# Patient Record
Sex: Male | Born: 1980 | Race: White | Hispanic: No | Marital: Married | Smoking: Never smoker
Health system: Southern US, Community
[De-identification: ages and names within clinical notes are randomized; demographics above are authoritative.]

## PROBLEM LIST (undated history)

## (undated) DIAGNOSIS — J45909 Unspecified asthma, uncomplicated: Secondary | ICD-10-CM

## (undated) HISTORY — PX: BONE MARROW TRANSPLANT: SHX200

---

## 2021-02-04 ENCOUNTER — Emergency Department
Admission: EM | Admit: 2021-02-04 | Discharge: 2021-02-04 | Disposition: A | Discharge status: Home / Self Care | Payer: Commercial Managed Care - PPO | Attending: Emergency Medicine | Admitting: Emergency Medicine

## 2021-02-04 ENCOUNTER — Other Ambulatory Visit: Payer: Self-pay

## 2021-02-04 ENCOUNTER — Emergency Department: Payer: Commercial Managed Care - PPO

## 2021-02-04 ENCOUNTER — Encounter: Payer: Self-pay | Admitting: Emergency Medicine

## 2021-02-04 DIAGNOSIS — Z20822 Contact with and (suspected) exposure to covid-19: Secondary | ICD-10-CM | POA: Insufficient documentation

## 2021-02-04 DIAGNOSIS — J40 Bronchitis, not specified as acute or chronic: Secondary | ICD-10-CM | POA: Insufficient documentation

## 2021-02-04 DIAGNOSIS — R059 Cough, unspecified: Secondary | ICD-10-CM | POA: Diagnosis present

## 2021-02-04 HISTORY — DX: Unspecified asthma, uncomplicated: J45.909

## 2021-02-04 LAB — SARS CORONAVIRUS 2 (TAT 6-24 HRS): SARS Coronavirus 2: NEGATIVE

## 2021-02-04 MED ORDER — HYDROCOD POLST-CPM POLST ER 10-8 MG/5ML PO SUER
5.0000 mL | Freq: Once | ORAL | Status: AC
Start: 2021-02-04 — End: 2021-02-04
  Administered 2021-02-04: 5 mL via ORAL
  Filled 2021-02-04: qty 5

## 2021-02-04 MED ORDER — IBUPROFEN 800 MG PO TABS: 800.0000 mg | ORAL_TABLET | Freq: Three times a day (TID) | ORAL | 0 refills | Status: AC | PRN

## 2021-02-04 MED ORDER — IPRATROPIUM-ALBUTEROL 0.5-2.5 (3) MG/3ML IN SOLN
3.0000 mL | Freq: Once | RESPIRATORY_TRACT | Status: AC
  Administered 2021-02-04: 3 mL via RESPIRATORY_TRACT
  Filled 2021-02-04: qty 3

## 2021-02-04 MED ORDER — GUAIFENESIN ER 600 MG PO TB12: 600.0000 mg | ORAL_TABLET | Freq: Two times a day (BID) | ORAL | 2 refills | Status: AC

## 2021-02-04 MED ORDER — FEXOFENADINE-PSEUDOEPHED ER 60-120 MG PO TB12: 1.0000 | ORAL_TABLET | Freq: Two times a day (BID) | ORAL | 0 refills | Status: AC

## 2021-02-04 MED ORDER — METHYLPREDNISOLONE SODIUM SUCC 125 MG IJ SOLR
125.0000 mg | Freq: Once | INTRAMUSCULAR | Status: AC
  Administered 2021-02-04: 125 mg via INTRAMUSCULAR
  Filled 2021-02-04: qty 2

## 2021-02-04 MED ORDER — ALBUTEROL SULFATE (2.5 MG/3ML) 0.083% IN NEBU: 2.5000 mg | INHALATION_SOLUTION | RESPIRATORY_TRACT | 2 refills | Status: AC | PRN

## 2021-02-04 NOTE — ED Notes (Signed)
See triage note  Presents with cough,congestion and some body aches  States he has a hx of asthma and has used inhalers w/o relief  Having some discomfort in chest with breathing  Afebrile on arrival

## 2021-02-04 NOTE — Discharge Instructions (Addendum)
Your chest x-ray was negative for pneumonia.  Findings are consistent with bronchitis.  Read and follow discharge care instructions.  Take medication as directed.  Advised self quarantine pending results of COVID-19 test.  If test is positive was quarantine additional 5 days.  Results of COVID-19 test can be found later in the MyChart app.

## 2021-02-04 NOTE — ED Triage Notes (Signed)
C/O cough, sinus congestion x 1 day.  C/O body aches and lungs burning with cough.

## 2021-02-04 NOTE — ED Provider Notes (Signed)
[facility] Emergency Department Provider Note   ____________________________________________   Event Date/Time   First MD Initiated Contact with Patient 02/04/21 1015     (approximate)  I have reviewed the triage vital signs and the nursing notes.   HISTORY  Chief Complaint Cough    HPI Rosaire Cueto is a 40 y.o. male patient presents with cough and sinus congestion for 1 day.  Patient states cough and body aches and loss of breath with coughing.  Denies recent travel or known contact with COVID-19.  Patient is taking the vaccine but no boosters.  Patient is not taking the flu shot.  History of asthma.  Denies chest pain.         Past Medical History:  Diagnosis Date  . Asthma     There are no problems to display for this patient.   Past Surgical History:  Procedure Laterality Date  . BONE MARROW TRANSPLANT      Prior to Admission medications   Medication Sig Start Date End Date Taking? Authorizing Provider  albuterol (PROVENTIL) (2.5 MG/3ML) 0.083% nebulizer solution Take 3 mLs (2.5 mg total) by nebulization every 4 (four) hours as needed for wheezing or shortness of breath. 02/04/21 02/04/22 Yes Joni Reining, PA-C  albuterol (VENTOLIN HFA) 108 (90 Base) MCG/ACT inhaler Inhale 2 puffs into the lungs every 6 (six) hours as needed for wheezing or shortness of breath.   Yes [provider]  fexofenadine-pseudoephedrine (ALLEGRA-D) 60-120 MG 12 hr tablet Take 1 tablet by mouth 2 (two) times daily. 02/04/21  Yes Joni Reining, PA-C  fluticasone-salmeterol (ADVAIR HFA) 230-21 MCG/ACT inhaler Inhale 2 puffs into the lungs 2 (two) times daily.   Yes [provider]  guaiFENesin (MUCINEX) 600 MG 12 hr tablet Take 1 tablet (600 mg total) by mouth 2 (two) times daily. 02/04/21 02/04/22 Yes Joni Reining, PA-C  ibuprofen (ADVIL) 800 MG tablet Take 1 tablet (800 mg total) by mouth every 8 (eight) hours as needed for moderate pain. 02/04/21   Yes Joni Reining, PA-C  montelukast (SINGULAIR) 10 MG tablet Take 10 mg by mouth at bedtime.   Yes [provider]    Allergies Patient has no known allergies.  No family history on file.  Social History Social History   Tobacco Use  . Smoking status: Never Smoker  . Smokeless tobacco: Never Used    Review of Systems Constitutional: No fever/chills Eyes: No visual changes. ENT: No sore throat. Cardiovascular: Denies chest pain. Respiratory: Cough and sinus congestion. Gastrointestinal: No abdominal pain.  No nausea, no vomiting.  No diarrhea.  No constipation. Genitourinary: Negative for dysuria. Musculoskeletal: Negative for back pain. Skin: Negative for rash. Neurological: Negative for headaches, focal weakness or numbness.   ____________________________________________   PHYSICAL EXAM:  VITAL SIGNS: ED Triage Vitals  Enc Vitals Group     BP 02/04/21 0942 (!) 145/108     Pulse Rate 02/04/21 0942 (!) 109     Resp 02/04/21 0942 20     Temp 02/04/21 0942 97.9 F (36.6 C)     Temp Source 02/04/21 0942 Oral     SpO2 02/04/21 0942 98 %     Weight 02/04/21 0940 155 lb (70.3 kg)     Height 02/04/21 0940 5\' 5"  (1.651 m)     Head Circumference --      Peak Flow --      Pain Score 02/04/21 0940 0     Pain Loc --  Pain Edu? --      Excl. in GC? --    Constitutional: Alert and oriented. Well appearing and in no acute distress. Eyes: Conjunctivae are normal. PERRL. EOMI. Head: Atraumatic. Nose: Tenderness nasal turbinates clear rhinorrhea. Mouth/Throat: Mucous membranes are moist.  Oropharynx non-erythematous. Neck: No stridor.  Hematological/Lymphatic/Immunilogical: No cervical lymphadenopathy. Cardiovascular: Normal rate, regular rhythm. Grossly normal heart sounds.  Good peripheral circulation.  Elevated diastolic blood pressure Respiratory: Normal respiratory effort.  No retractions. Lungs CTAB. Gastrointestinal: Soft and nontender. No distention.  No abdominal bruits. No CVA tenderness. Neurologic:  Normal speech and language. No gross focal neurologic deficits are appreciated. No gait instability. Skin:  Skin is warm, dry and intact. No rash noted. Psychiatric: Mood and affect are normal. Speech and behavior are normal.  ____________________________________________   LABS (all labs ordered are listed, but only abnormal results are displayed)  Labs Reviewed  SARS CORONAVIRUS 2 (TAT 6-24 HRS)   ____________________________________________  EKG   ____________________________________________  RADIOLOGY I, Joni Reining, personally viewed and evaluated these images (plain radiographs) as part of my medical decision making, as well as reviewing the written report by the radiologist.  ED MD interpretation: Chest x-ray finding consistent with bronchitis.  Official radiology report(s): DG Chest 2 View  Result Date: 02/04/2021 CLINICAL DATA:  Cough, hard to breathe, shortness of breath, congestion, and body aches for 1 day, history asthma EXAM: CHEST - 2 VIEW COMPARISON:  None FINDINGS: Normal heart size, mediastinal contours, and pulmonary vascularity. Azygos fissure noted. Mild peribronchial thickening. No pulmonary infiltrate, pleural effusion, or pneumothorax. Old fractures of the lateral LEFT fifth and sixth ribs. IMPRESSION: Peribronchial thickening which could reflect bronchitis or asthma. No acute infiltrate. Electronically Signed   By: Ulyses Southward M.D.   On: 02/04/2021 10:46    ____________________________________________   PROCEDURES  Procedure(s) performed (including Critical Care):  Procedures   ____________________________________________   INITIAL IMPRESSION / ASSESSMENT AND PLAN / ED COURSE  As part of my medical decision making, I reviewed the following data within the electronic MEDICAL RECORD NUMBER         Patient presents with cough and sinus congestion for 1 day.  Patient also complain of body aches  and cold lungs burning with cough".      ____________________________________________   FINAL CLINICAL IMPRESSION(S) / ED DIAGNOSES  Final diagnoses:  Bronchitis     ED Discharge Orders         Ordered    albuterol (PROVENTIL) (2.5 MG/3ML) 0.083% nebulizer solution  Every 4 hours PRN        02/04/21 1103    fexofenadine-pseudoephedrine (ALLEGRA-D) 60-120 MG 12 hr tablet  2 times daily        02/04/21 1103    guaiFENesin (MUCINEX) 600 MG 12 hr tablet  2 times daily        02/04/21 1103    ibuprofen (ADVIL) 800 MG tablet  Every 8 hours PRN        02/04/21 1104           Note:  This document was prepared using Dragon voice recognition software and may include unintentional dictation errors.    Joni Reining, PA-C 02/04/21 1105    Gilles Chiquito, MD 02/04/21 1125

## 2022-08-18 IMAGING — CR DG CHEST 2V
1 series · 2 of 2 positions shown · non-contrast
Comparison: None

CLINICAL DATA: Cough, hard to breathe, shortness of breath,
congestion, and body aches for 1 day, history asthma

EXAM:
CHEST - 2 VIEW

[Series 1: w chest pa · 0.14mm/px · 2 of 2 slices shown]
[im 1/2]
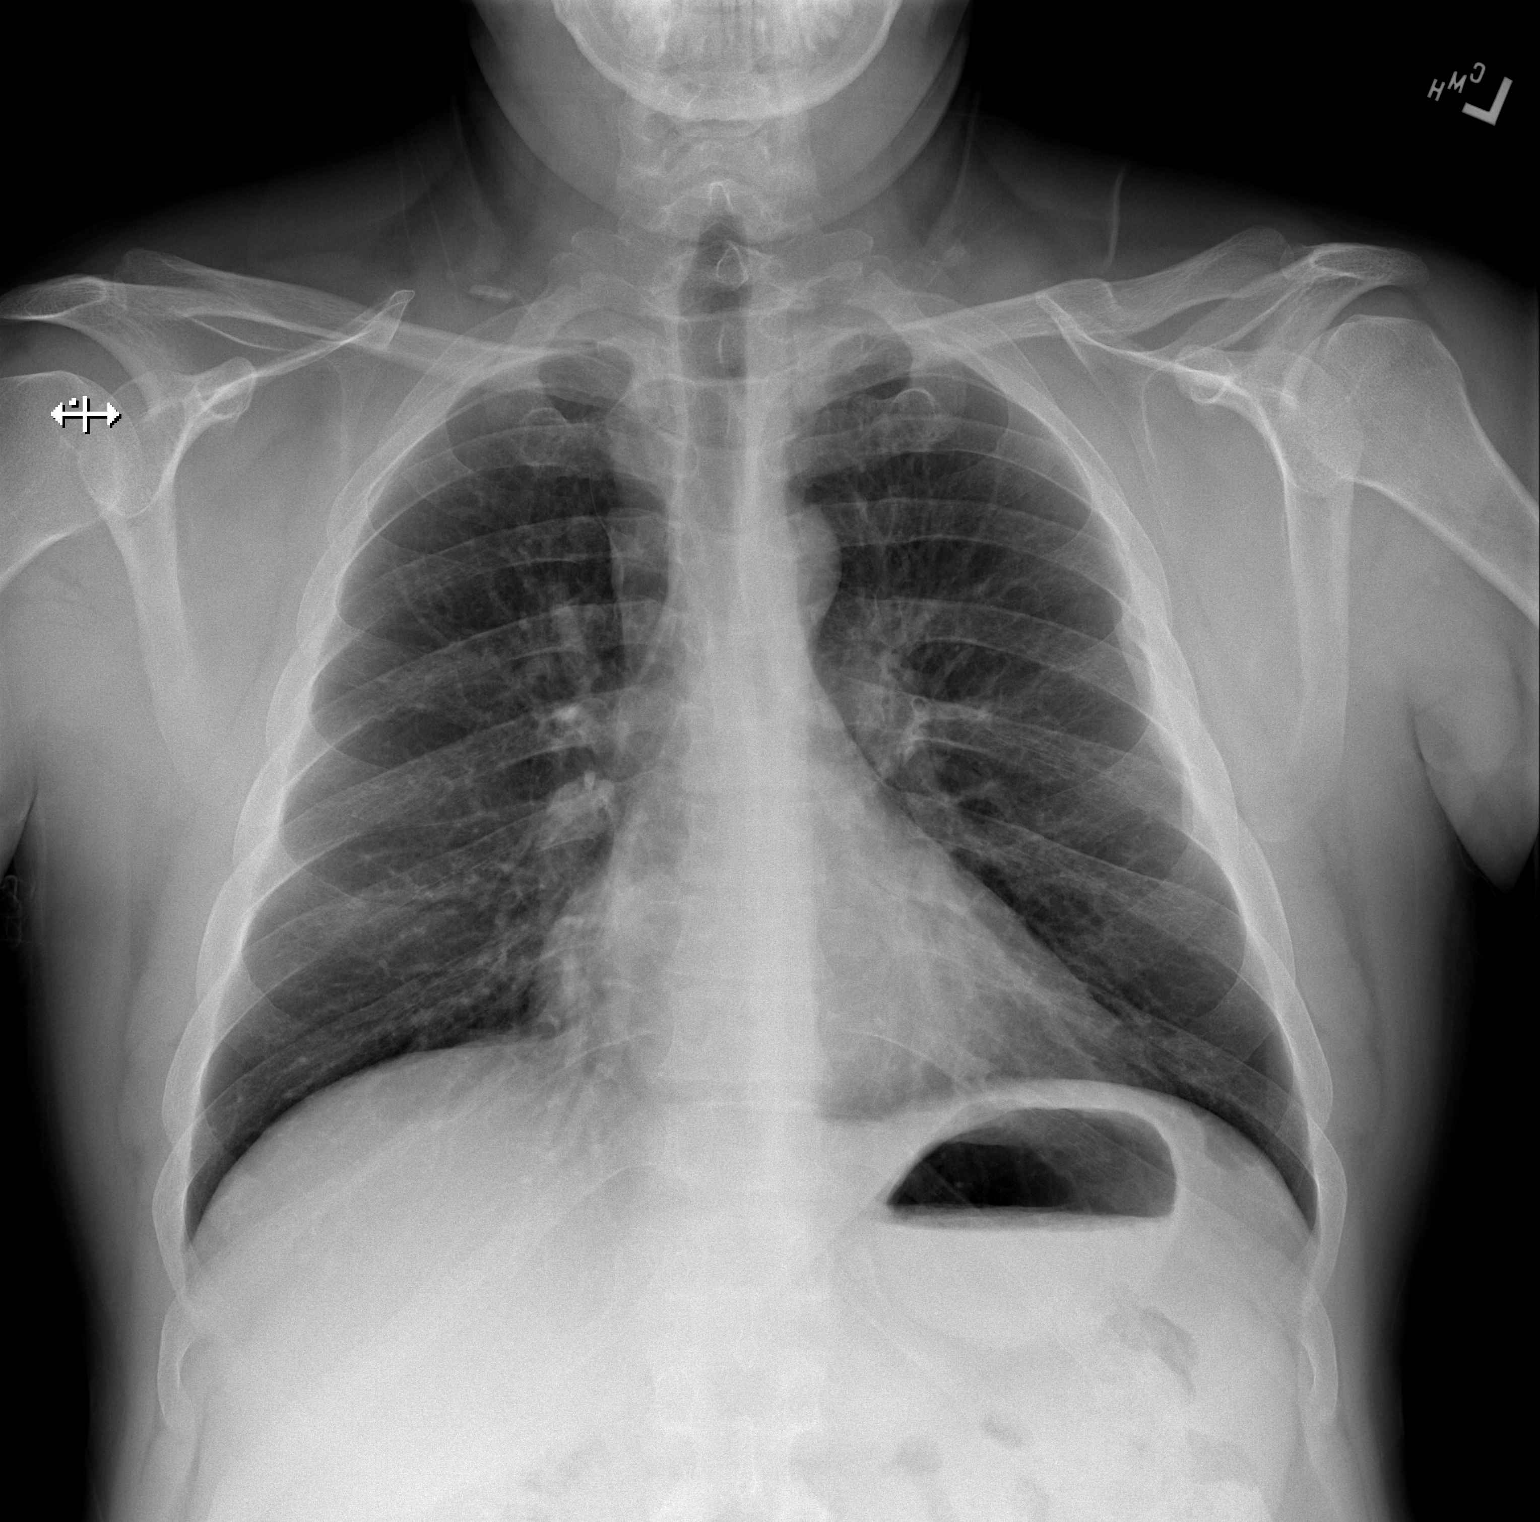
[im 2/2]
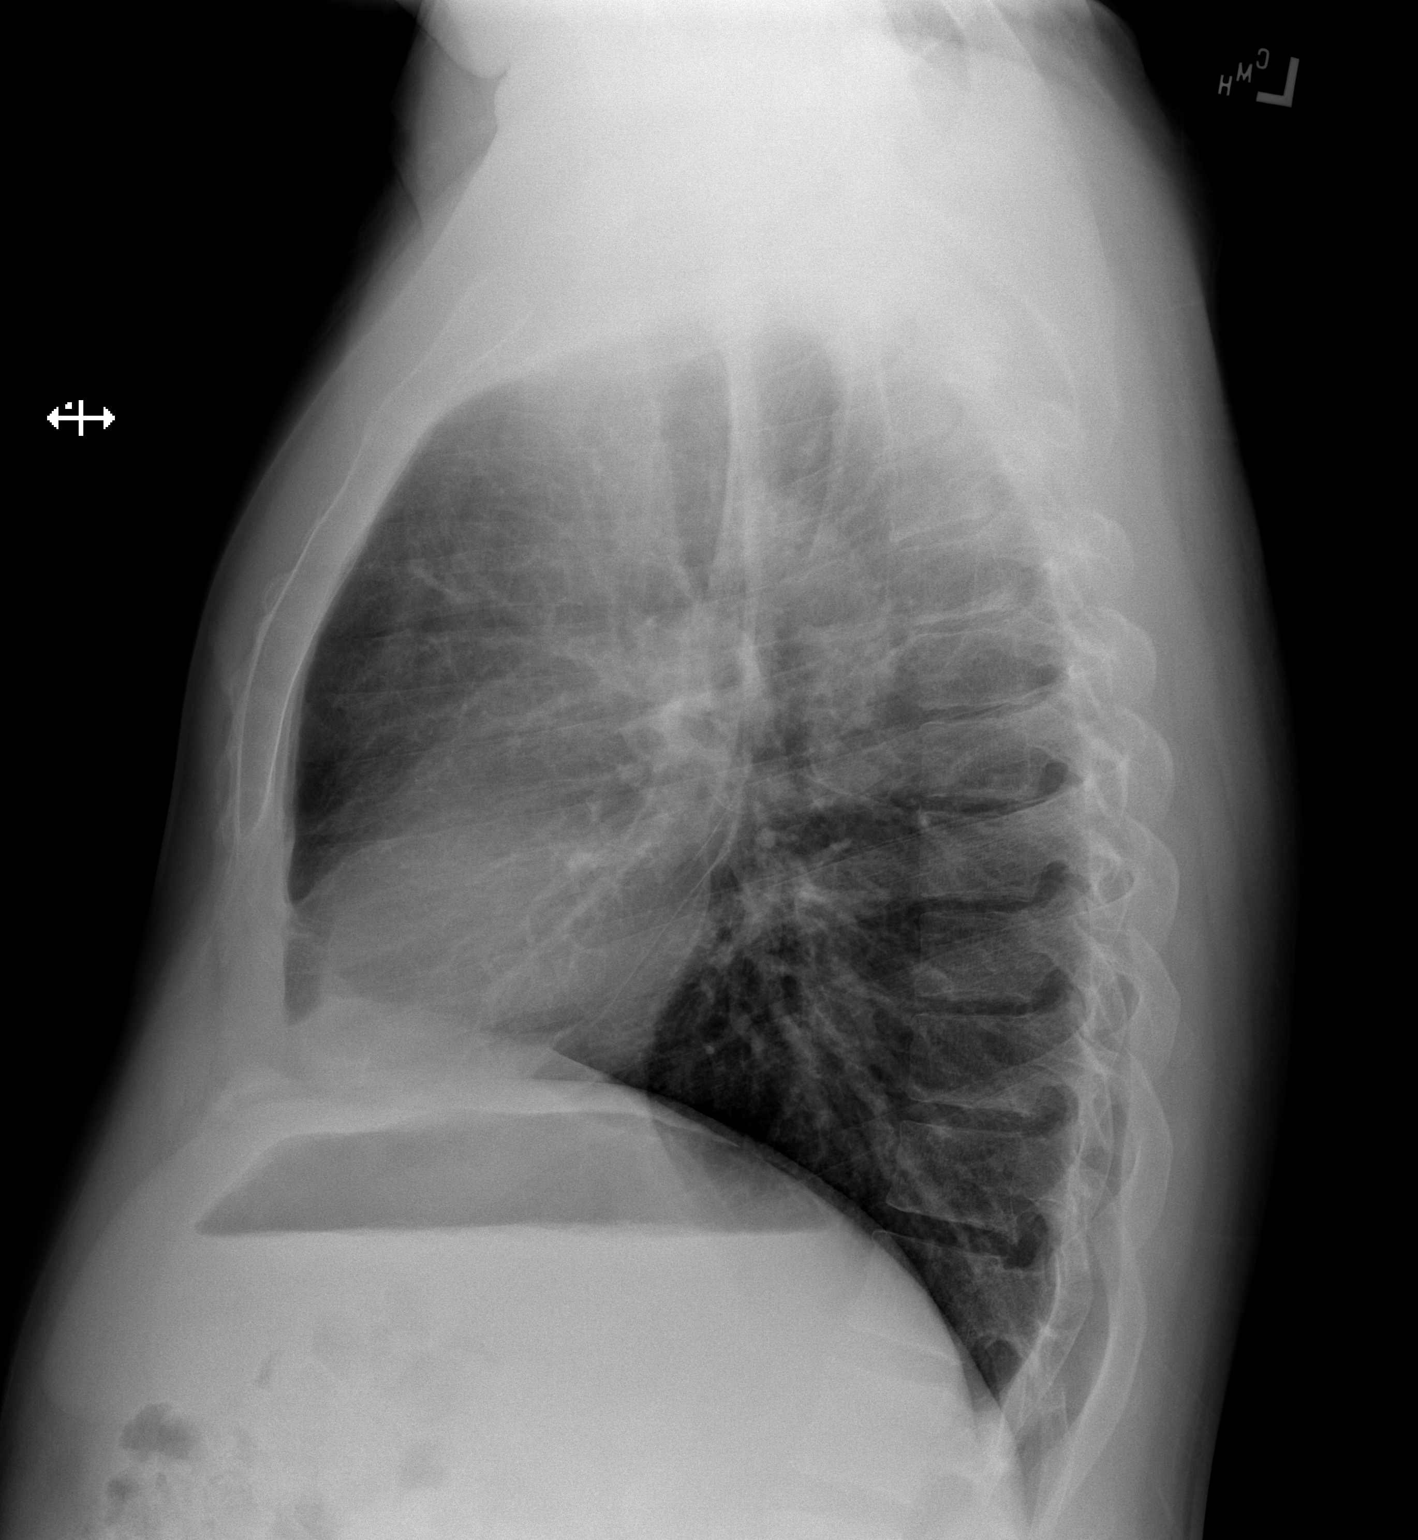

[2 of 2 positions shown; findings below may reference images not displayed]

FINDINGS: Normal heart size, mediastinal contours, and pulmonary vascularity.

Azygos fissure noted.

Mild peribronchial thickening.

No pulmonary infiltrate, pleural effusion, or pneumothorax.

Old fractures of the lateral LEFT fifth and sixth ribs.
IMPRESSION: Peribronchial thickening which could reflect bronchitis or asthma.

No acute infiltrate.
